# Patient Record
Sex: Female | Born: 1935 | Race: White | Hispanic: No | Smoking: Never smoker
Health system: Southern US, Community
[De-identification: ages and names within clinical notes are randomized; demographics above are authoritative.]

## PROBLEM LIST (undated history)

## (undated) DIAGNOSIS — K224 Dyskinesia of esophagus: Secondary | ICD-10-CM

## (undated) DIAGNOSIS — I10 Essential (primary) hypertension: Secondary | ICD-10-CM

## (undated) DIAGNOSIS — C541 Malignant neoplasm of endometrium: Secondary | ICD-10-CM

## (undated) DIAGNOSIS — M7550 Bursitis of unspecified shoulder: Secondary | ICD-10-CM

## (undated) DIAGNOSIS — J449 Chronic obstructive pulmonary disease, unspecified: Secondary | ICD-10-CM

## (undated) DIAGNOSIS — G4730 Sleep apnea, unspecified: Secondary | ICD-10-CM

## (undated) HISTORY — PX: CHOLECYSTECTOMY: SHX55

## (undated) HISTORY — PX: ABSCESS DRAINAGE: SHX1119

## (undated) HISTORY — PX: APPENDECTOMY: SHX54

## (undated) HISTORY — PX: ABDOMINAL HYSTERECTOMY: SHX81

## (undated) HISTORY — PX: ABDOMINAL SURGERY: SHX537

## (undated) HISTORY — PX: SALPINGECTOMY: SHX328

---

## 2009-06-30 ENCOUNTER — Ambulatory Visit: Payer: Self-pay | Admitting: Radiology

## 2009-06-30 ENCOUNTER — Emergency Department (HOSPITAL_BASED_OUTPATIENT_CLINIC_OR_DEPARTMENT_OTHER): Admission: EM | Admit: 2009-06-30 | Discharge: 2009-06-30 | Payer: Self-pay | Admitting: Emergency Medicine

## 2010-11-30 IMAGING — CR DG KNEE COMPLETE 4+V*R*
4 series · 4 of 4 positions shown · non-contrast
Comparison: None

CLINICAL DATA: Right anterior and lateral knee pain - no known
injury

RIGHT KNEE - COMPLETE 4+ VIEW

[t knee ap right]
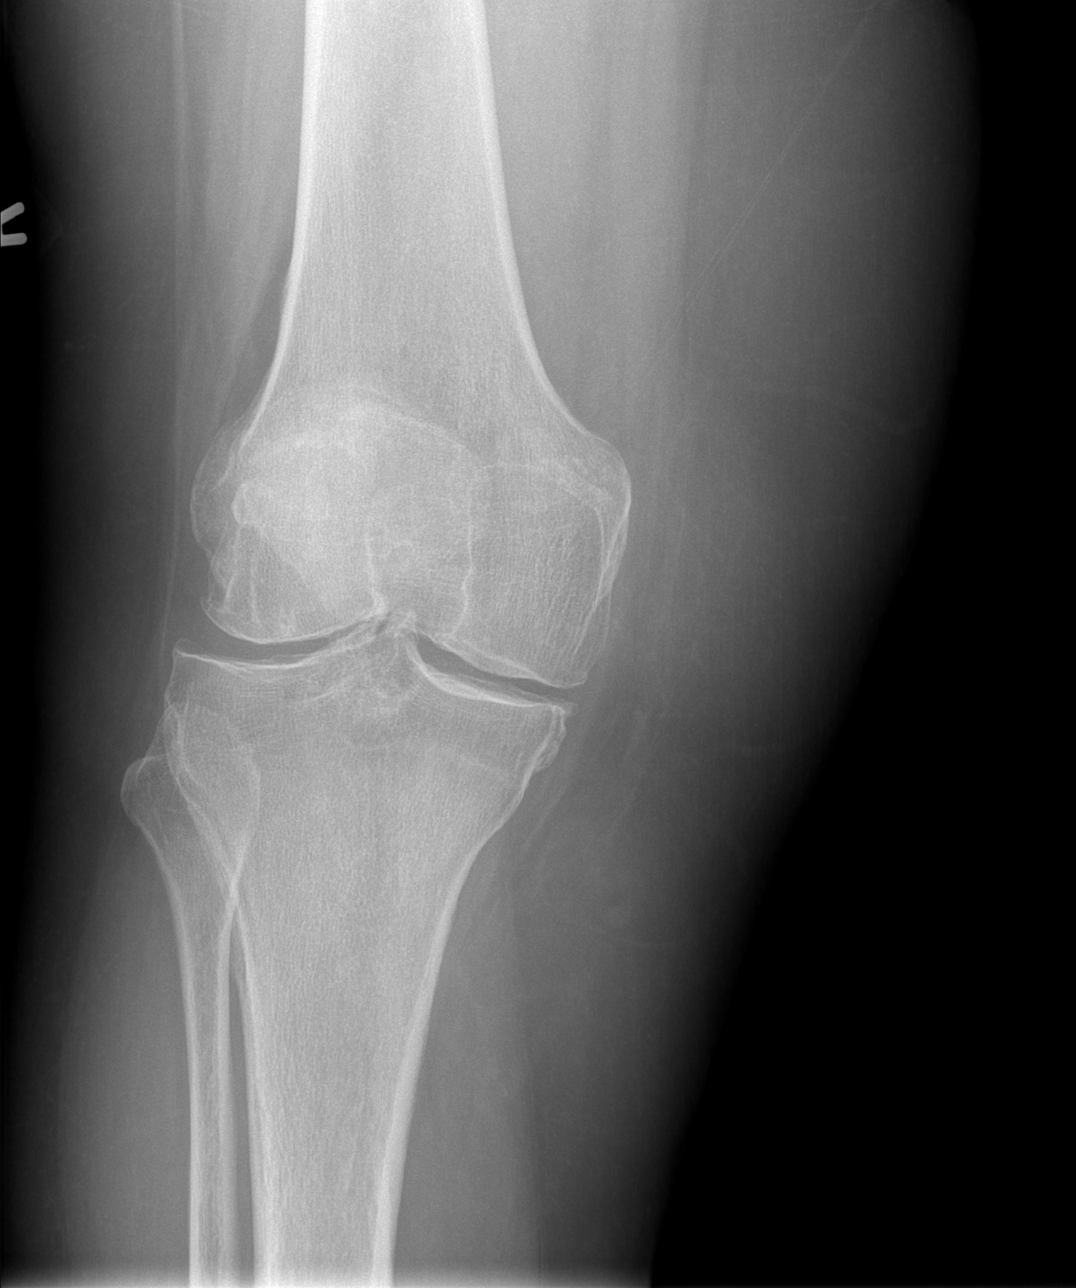

[t knee oblique right (1 of 2)]
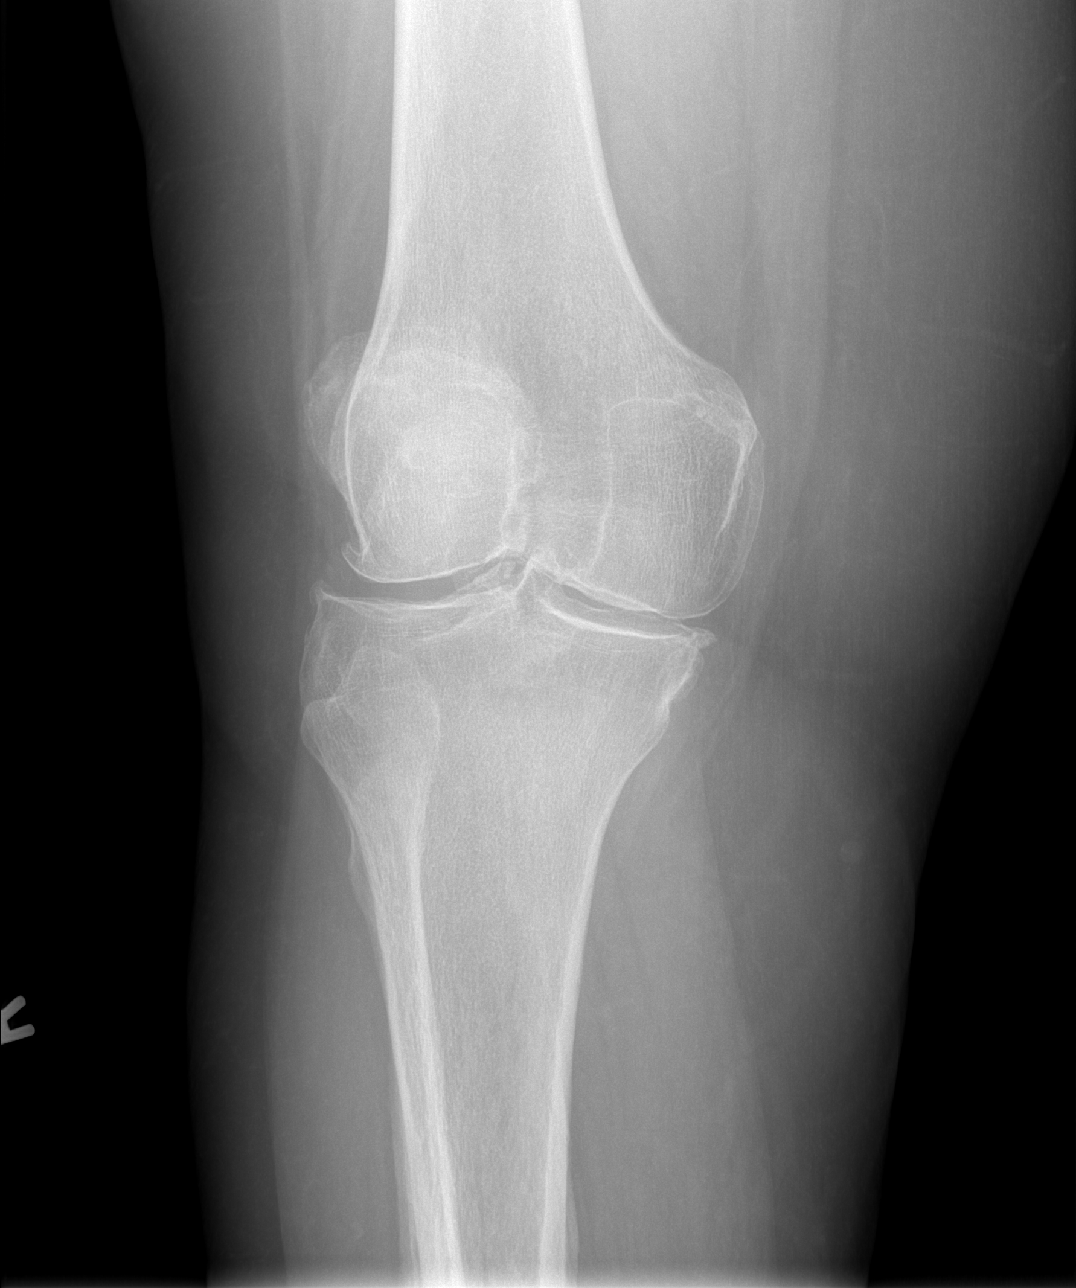

[t knee oblique right (2 of 2)]
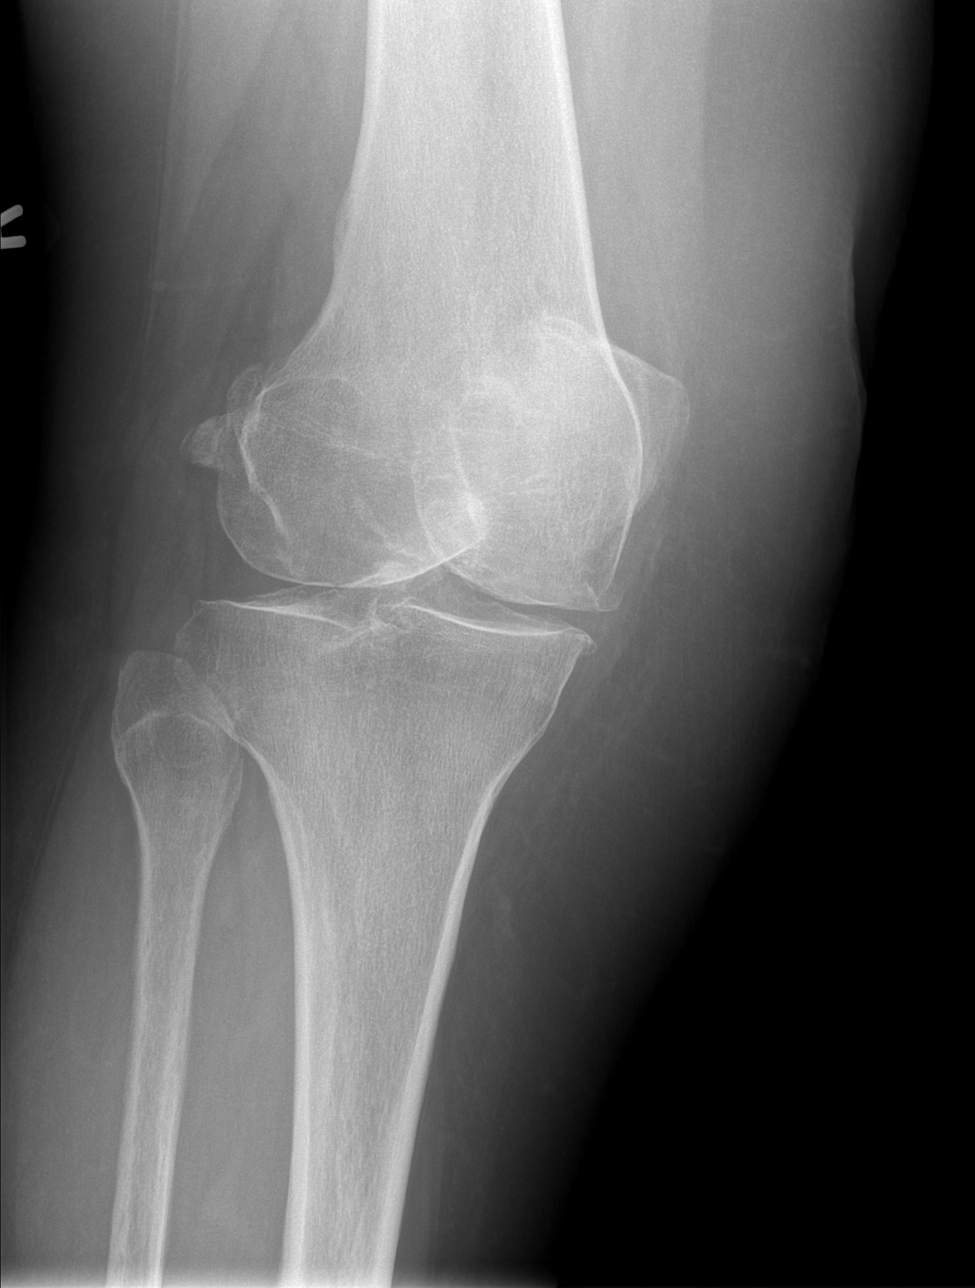

[t knee lat right]
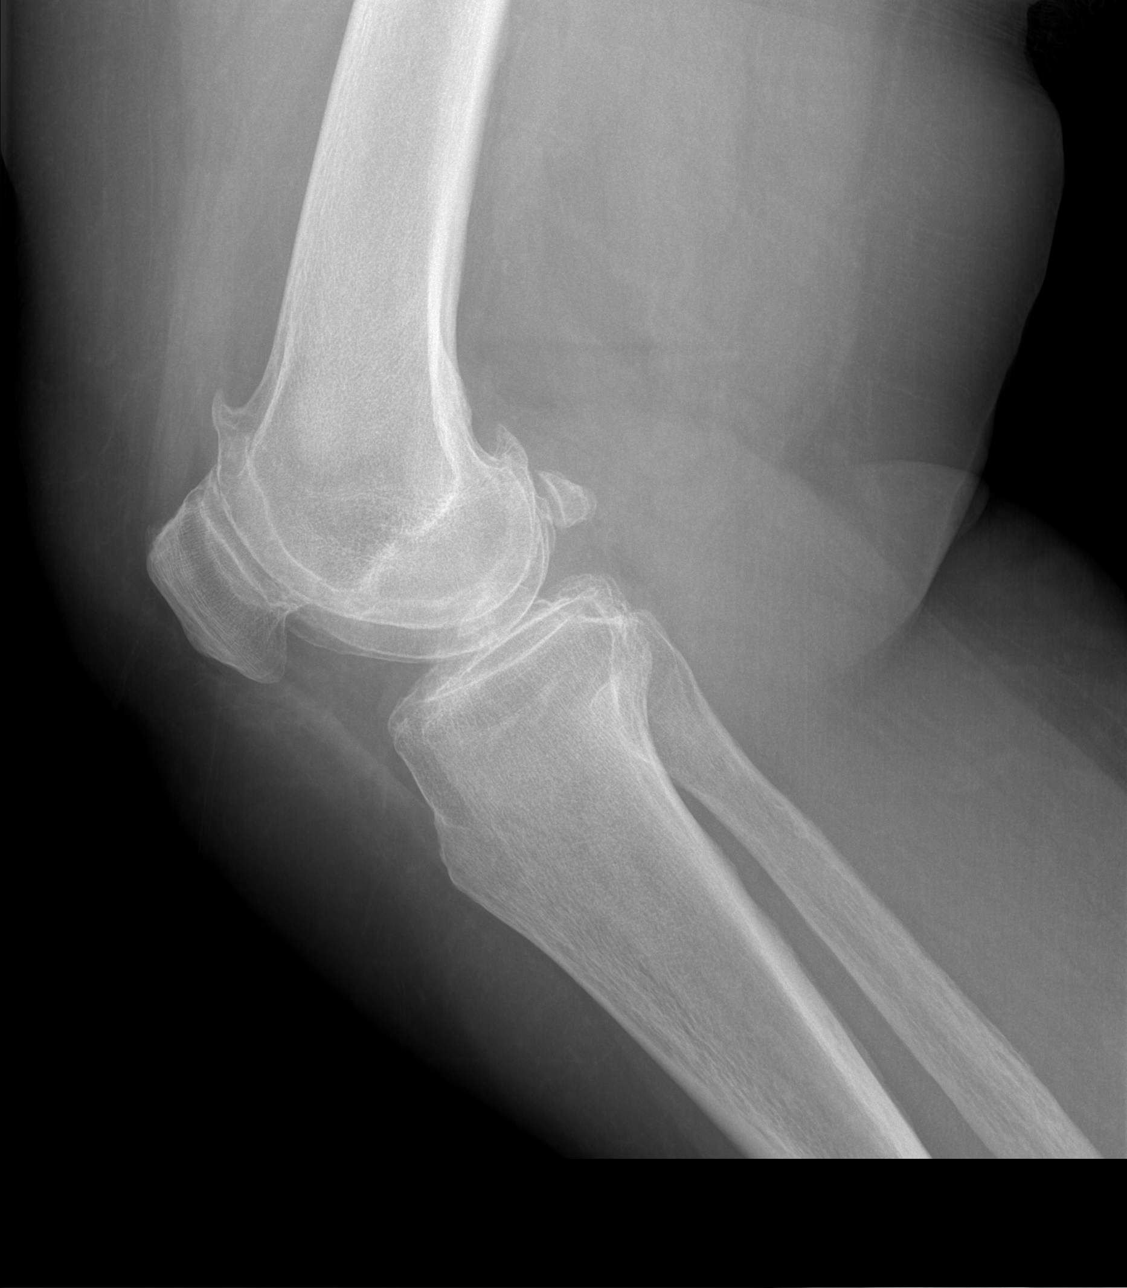

[4 of 4 positions shown; findings below may reference images not displayed]

FINDINGS: Moderate to advanced tricompartmental degenerative
changes with no fracture or dislocation.  No definite joint
effusion.

On the lateral view, there appear to be exostosis emanating off the
distal femur, anteriorly and posteriorly.
IMPRESSION: Degenerative changes and distal femoral exostoses - no acute
findings.

## 2012-10-23 ENCOUNTER — Emergency Department (HOSPITAL_BASED_OUTPATIENT_CLINIC_OR_DEPARTMENT_OTHER): Payer: Medicare Other

## 2012-10-23 ENCOUNTER — Emergency Department (HOSPITAL_BASED_OUTPATIENT_CLINIC_OR_DEPARTMENT_OTHER)
Admission: EM | Admit: 2012-10-23 | Discharge: 2012-10-23 | Disposition: A | Discharge status: Home / Self Care | Payer: Medicare Other | Attending: Emergency Medicine | Admitting: Emergency Medicine

## 2012-10-23 ENCOUNTER — Encounter (HOSPITAL_BASED_OUTPATIENT_CLINIC_OR_DEPARTMENT_OTHER): Payer: Self-pay | Admitting: *Deleted

## 2012-10-23 DIAGNOSIS — Z8739 Personal history of other diseases of the musculoskeletal system and connective tissue: Secondary | ICD-10-CM | POA: Insufficient documentation

## 2012-10-23 DIAGNOSIS — J449 Chronic obstructive pulmonary disease, unspecified: Secondary | ICD-10-CM | POA: Insufficient documentation

## 2012-10-23 DIAGNOSIS — R059 Cough, unspecified: Secondary | ICD-10-CM | POA: Insufficient documentation

## 2012-10-23 DIAGNOSIS — M25559 Pain in unspecified hip: Secondary | ICD-10-CM | POA: Insufficient documentation

## 2012-10-23 DIAGNOSIS — R05 Cough: Secondary | ICD-10-CM | POA: Insufficient documentation

## 2012-10-23 DIAGNOSIS — Z8542 Personal history of malignant neoplasm of other parts of uterus: Secondary | ICD-10-CM | POA: Insufficient documentation

## 2012-10-23 DIAGNOSIS — J4489 Other specified chronic obstructive pulmonary disease: Secondary | ICD-10-CM | POA: Insufficient documentation

## 2012-10-23 DIAGNOSIS — Z8719 Personal history of other diseases of the digestive system: Secondary | ICD-10-CM | POA: Insufficient documentation

## 2012-10-23 DIAGNOSIS — R5383 Other fatigue: Secondary | ICD-10-CM

## 2012-10-23 DIAGNOSIS — R5381 Other malaise: Secondary | ICD-10-CM | POA: Insufficient documentation

## 2012-10-23 DIAGNOSIS — J3489 Other specified disorders of nose and nasal sinuses: Secondary | ICD-10-CM | POA: Insufficient documentation

## 2012-10-23 DIAGNOSIS — M25519 Pain in unspecified shoulder: Secondary | ICD-10-CM | POA: Insufficient documentation

## 2012-10-23 DIAGNOSIS — Z79899 Other long term (current) drug therapy: Secondary | ICD-10-CM | POA: Insufficient documentation

## 2012-10-23 DIAGNOSIS — I10 Essential (primary) hypertension: Secondary | ICD-10-CM | POA: Insufficient documentation

## 2012-10-23 HISTORY — DX: Sleep apnea, unspecified: G47.30

## 2012-10-23 HISTORY — DX: Chronic obstructive pulmonary disease, unspecified: J44.9

## 2012-10-23 HISTORY — DX: Malignant neoplasm of endometrium: C54.1

## 2012-10-23 HISTORY — DX: Dyskinesia of esophagus: K22.4

## 2012-10-23 HISTORY — DX: Bursitis of unspecified shoulder: M75.50

## 2012-10-23 HISTORY — DX: Essential (primary) hypertension: I10

## 2012-10-23 LAB — COMPREHENSIVE METABOLIC PANEL
ALT: 34 U/L (ref 0–35)
AST: 24 U/L (ref 0–37)
Alkaline Phosphatase: 81 U/L (ref 39–117)
BUN: 35 mg/dL — ABNORMAL HIGH (ref 6–23)
Calcium: 10.1 mg/dL (ref 8.4–10.5)
Chloride: 98 mEq/L (ref 96–112)
Creatinine, Ser: 0.7 mg/dL (ref 0.50–1.10)
Glucose, Bld: 121 mg/dL — ABNORMAL HIGH (ref 70–99)
Sodium: 135 mEq/L (ref 135–145)
Total Bilirubin: 0.2 mg/dL — ABNORMAL LOW (ref 0.3–1.2)

## 2012-10-23 LAB — CBC WITH DIFFERENTIAL/PLATELET
Band Neutrophils: 1 % (ref 0–10)
Eosinophils Absolute: 0.4 10*3/uL (ref 0.0–0.7)
MCH: 30.2 pg (ref 26.0–34.0)
MCHC: 33.9 g/dL (ref 30.0–36.0)
MCV: 89 fL (ref 78.0–100.0)
Monocytes Relative: 10 % (ref 3–12)
Neutro Abs: 7 10*3/uL (ref 1.7–7.7)
Neutrophils Relative %: 66 % (ref 43–77)
Platelets: 302 10*3/uL (ref 150–400)
RBC: 5.2 MIL/uL — ABNORMAL HIGH (ref 3.87–5.11)
RDW: 15.1 % (ref 11.5–15.5)

## 2012-10-23 LAB — LIPASE, BLOOD: Lipase: 29 U/L (ref 11–59)

## 2012-10-23 MED ORDER — VALSARTAN 160 MG PO TABS: 80.0000 mg | ORAL_TABLET | Freq: Every day | ORAL | Status: AC

## 2012-10-23 MED ORDER — HYDROCODONE-ACETAMINOPHEN 10-325 MG PO TABS
1.0000 | ORAL_TABLET | Freq: Four times a day (QID) | ORAL | Status: DC | PRN
Start: 2012-10-23 — End: 2014-06-03

## 2012-10-23 NOTE — ED Provider Notes (Signed)
History     CSN: [id number]  Arrival date & time 10/23/12  1239   First MD Initiated Contact with Patient 10/23/12 1254      Chief Complaint  Patient presents with  . Hip Pain  . Cough  . Shoulder Pain    (Consider location/radiation/quality/duration/timing/severity/associated sxs/prior treatment) HPI  The patient presents with multiple complaints. Chief complaint 1 cough, congestion.  She states that these symptoms began approximately 2 months ago.  Since onset has been persistent, with minimal improvement after 2 courses of antibiotics.  No appreciable change, no chest pain, dyspnea throughout. Chief complaint #2 left shoulder pain.  This began approximately one month ago, without clear precipitant other than possible awkward motion.  She states that since that time there has been pain persistently in left shoulder, sore, worse with motion or weight-bearing.  Minimally improved with OTC medication. Chief complaint #3 right hip pain.  This pain has been approximately one month ago without clear precipitant.  Since onset she has been evaluated with x-ray, and has had minimal relief with OTC medication.  The pain is worse with motion, is sore.  There is no associated dysuria or incontinence. Chief complaint for fatigue.  This has been ongoing for greater than 2 months, seems more present when the patient is active during the day.   Past Medical History  Diagnosis Date  . Hypertension   . Esophageal spasm   . COPD (chronic obstructive pulmonary disease)   . Sleep apnea     does not use cpap machine  . Bursitis of shoulder   . Endometrial ca     Past Surgical History  Procedure Date  . Cesarean section   . Salpingectomy   . Abdominal hysterectomy   . Appendectomy   . Cholecystectomy   . Abdominal surgery   . Abscess drainage     No family history on file.  History  Substance Use Topics  . Smoking status: Never Smoker   . Smokeless tobacco: Not on file  . Alcohol Use:  No    OB History    Grav Para Term Preterm Abortions TAB SAB Ect Mult Living                  Review of Systems  Constitutional:       Per HPI, otherwise negative  HENT:       Per HPI, otherwise negative  Eyes: Negative.   Respiratory:       Per HPI, otherwise negative  Cardiovascular:       Per HPI, otherwise negative  Gastrointestinal: Negative for vomiting.  Genitourinary: Negative.   Musculoskeletal:       Per HPI, otherwise negative  Skin: Negative.   Neurological: Negative for syncope.    Allergies  Quinine derivatives and Zithromax  Home Medications   Current Outpatient Rx  Name  Route  Sig  Dispense  Refill  . ALBUTEROL SULFATE HFA 108 (90 BASE) MCG/ACT IN AERS   Inhalation   Inhale 2 puffs into the lungs every 6 (six) hours as needed.         Marland Kitchen GLUCOSAMINE-CHONDROITIN 500-400 MG PO TABS   Oral   Take 1 tablet by mouth 3 (three) times daily.         Marland Kitchen HYDROCODONE-ACETAMINOPHEN 10-325 MG PO TABS   Oral   Take 1 tablet by mouth every 6 (six) hours as needed.         Marland Kitchen OLMESARTAN MEDOXOMIL 5 MG PO TABS  Oral   Take 5 mg by mouth daily.         Marland Kitchen SPIRONOLACTONE 25 MG PO TABS   Oral   Take 25 mg by mouth daily.           BP 143/88  Pulse 74  Temp 98.4 F (36.9 C) (Oral)  Resp 20  Ht 5\' 7"  (1.702 m)  Wt 240 lb (108.863 kg)  BMI 37.59 kg/m2  SpO2 100%  Physical Exam  Nursing note and vitals reviewed. Constitutional: She is oriented to person, place, and time. She appears well-developed and well-nourished. No distress.  HENT:  Head: Normocephalic and atraumatic.  Eyes: Conjunctivae normal and EOM are normal.  Cardiovascular: Normal rate and regular rhythm.   Pulmonary/Chest: Effort normal and breath sounds normal. No stridor. No respiratory distress.  Abdominal: She exhibits no distension.  Musculoskeletal: She exhibits no edema.       Left shoulder: She exhibits tenderness, bony tenderness and pain. She exhibits no swelling, no  effusion, no crepitus, no deformity, normal pulse and normal strength.       Left elbow: Normal.       Legs: Neurological: She is alert and oriented to person, place, and time. No cranial nerve deficit.  Skin: Skin is warm and dry.  Psychiatric: She has a normal mood and affect.    ED Course  Procedures (including critical care time)   Labs Reviewed  CBC WITH DIFFERENTIAL  COMPREHENSIVE METABOLIC PANEL  LIPASE, BLOOD   No results found.   No diagnosis found.   O2- 99%ra, normal  2:51 PM I reviewed all results with the patient and her daughter.  The patient denies a history of COPD, which is in her notes. MDM  This patient presents with multiple complaints, none of which appear acutely new.  Overall, patient largely is concerned with fatigue, decreased capacity, and pain in several areas.  Her evaluation was largely unremarkable, her hemodynamic stability was also reassuring.  We discussed the need for primary care, orthopedics followup.  The patient requested a refill of several medications, this was accommodated.  Gerhard Munch, MD 10/23/12 1452

## 2012-10-23 NOTE — ED Notes (Addendum)
Patient states she had had cough and sinus congestion for the last 2 months.  Was seen at [facility] and treated with antibiotics.  States her right hip and left shoulder are now painful for one month.  Patient states she has multiple complaints of not feeling well for several months and just wants to know what is going on with her.

## 2014-03-25 IMAGING — CR DG CHEST 2V
2 series · 2 of 2 positions shown · non-contrast
Comparison: None.

CLINICAL DATA: Sick with cough and congestion.

CHEST - 2 VIEW

[w chest pa]
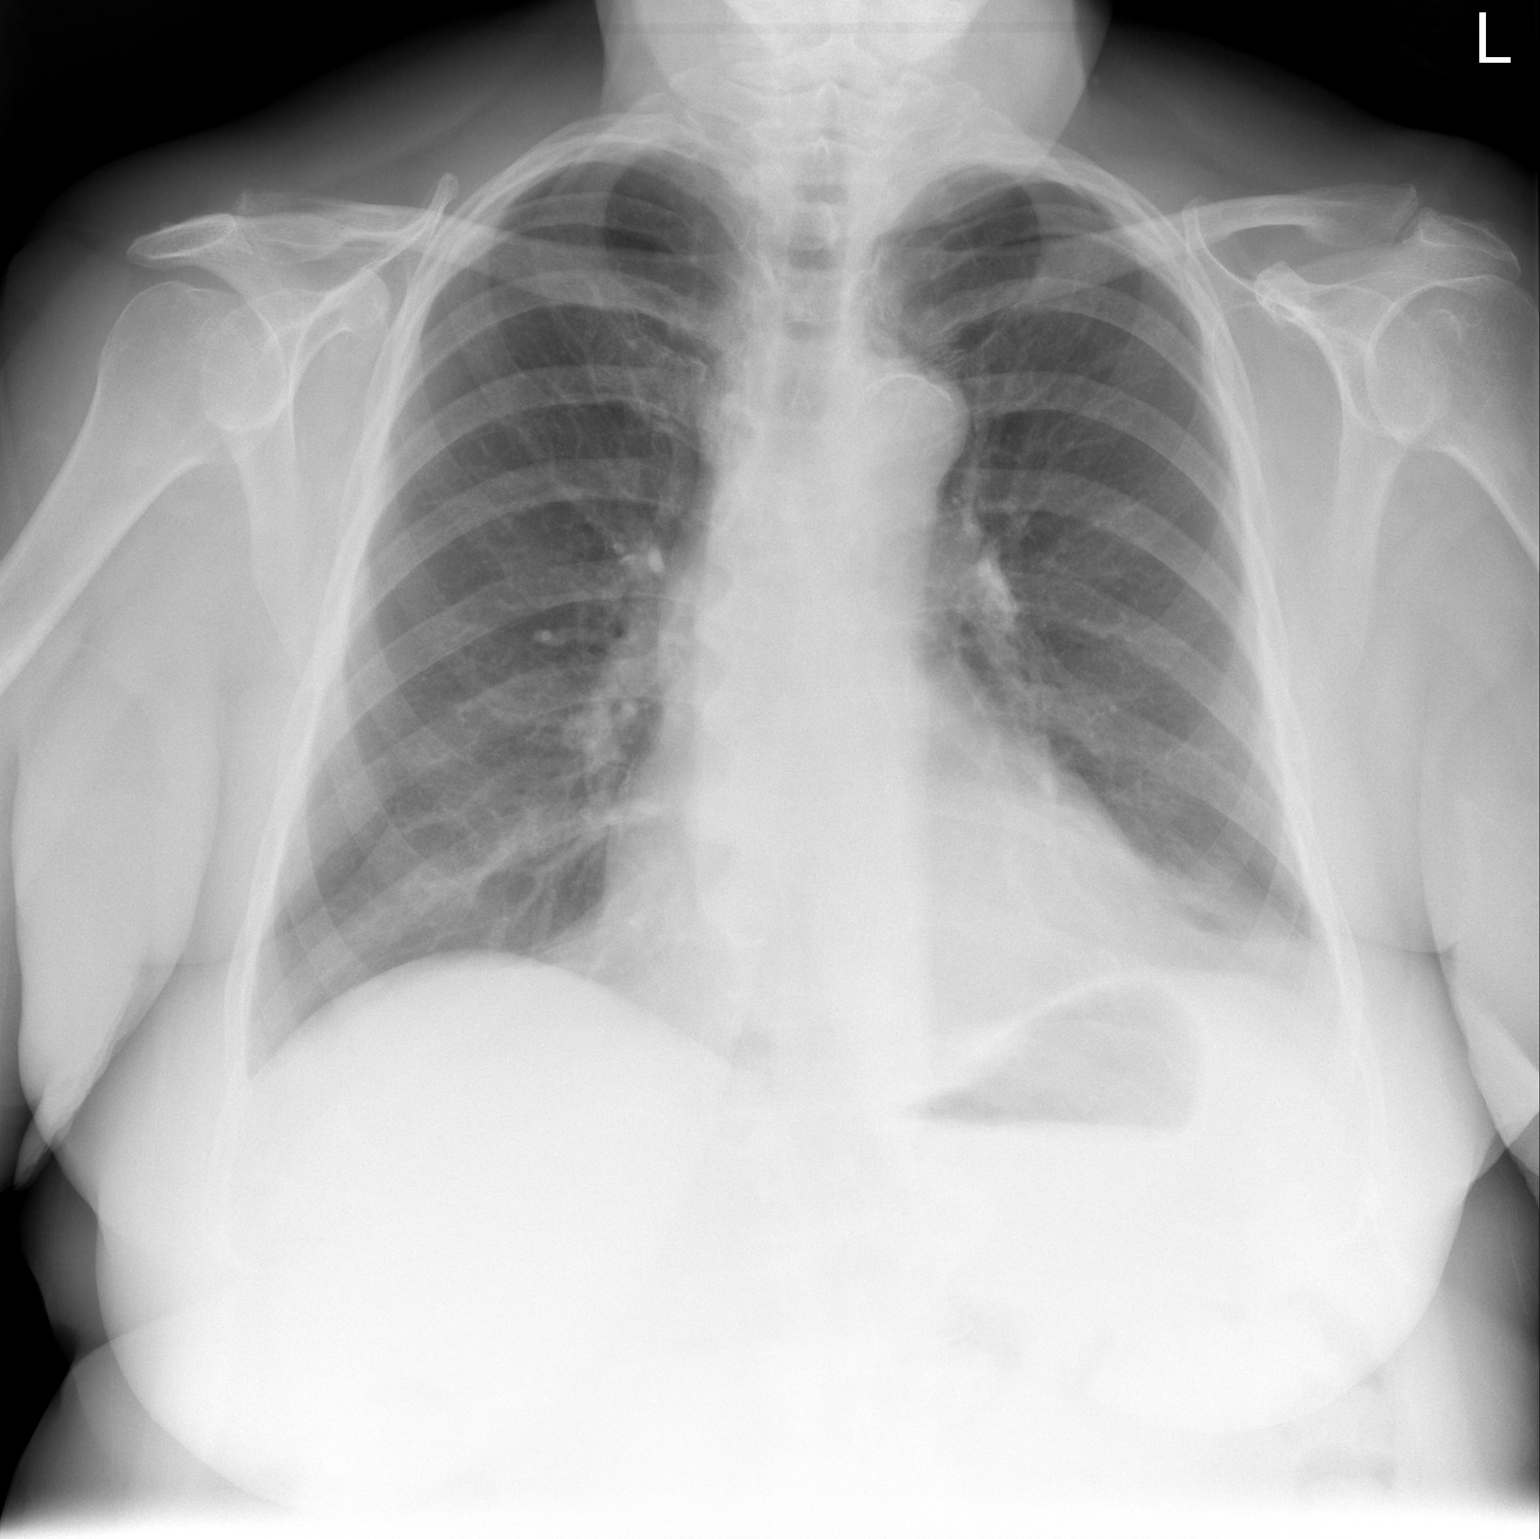

[w chest lat]
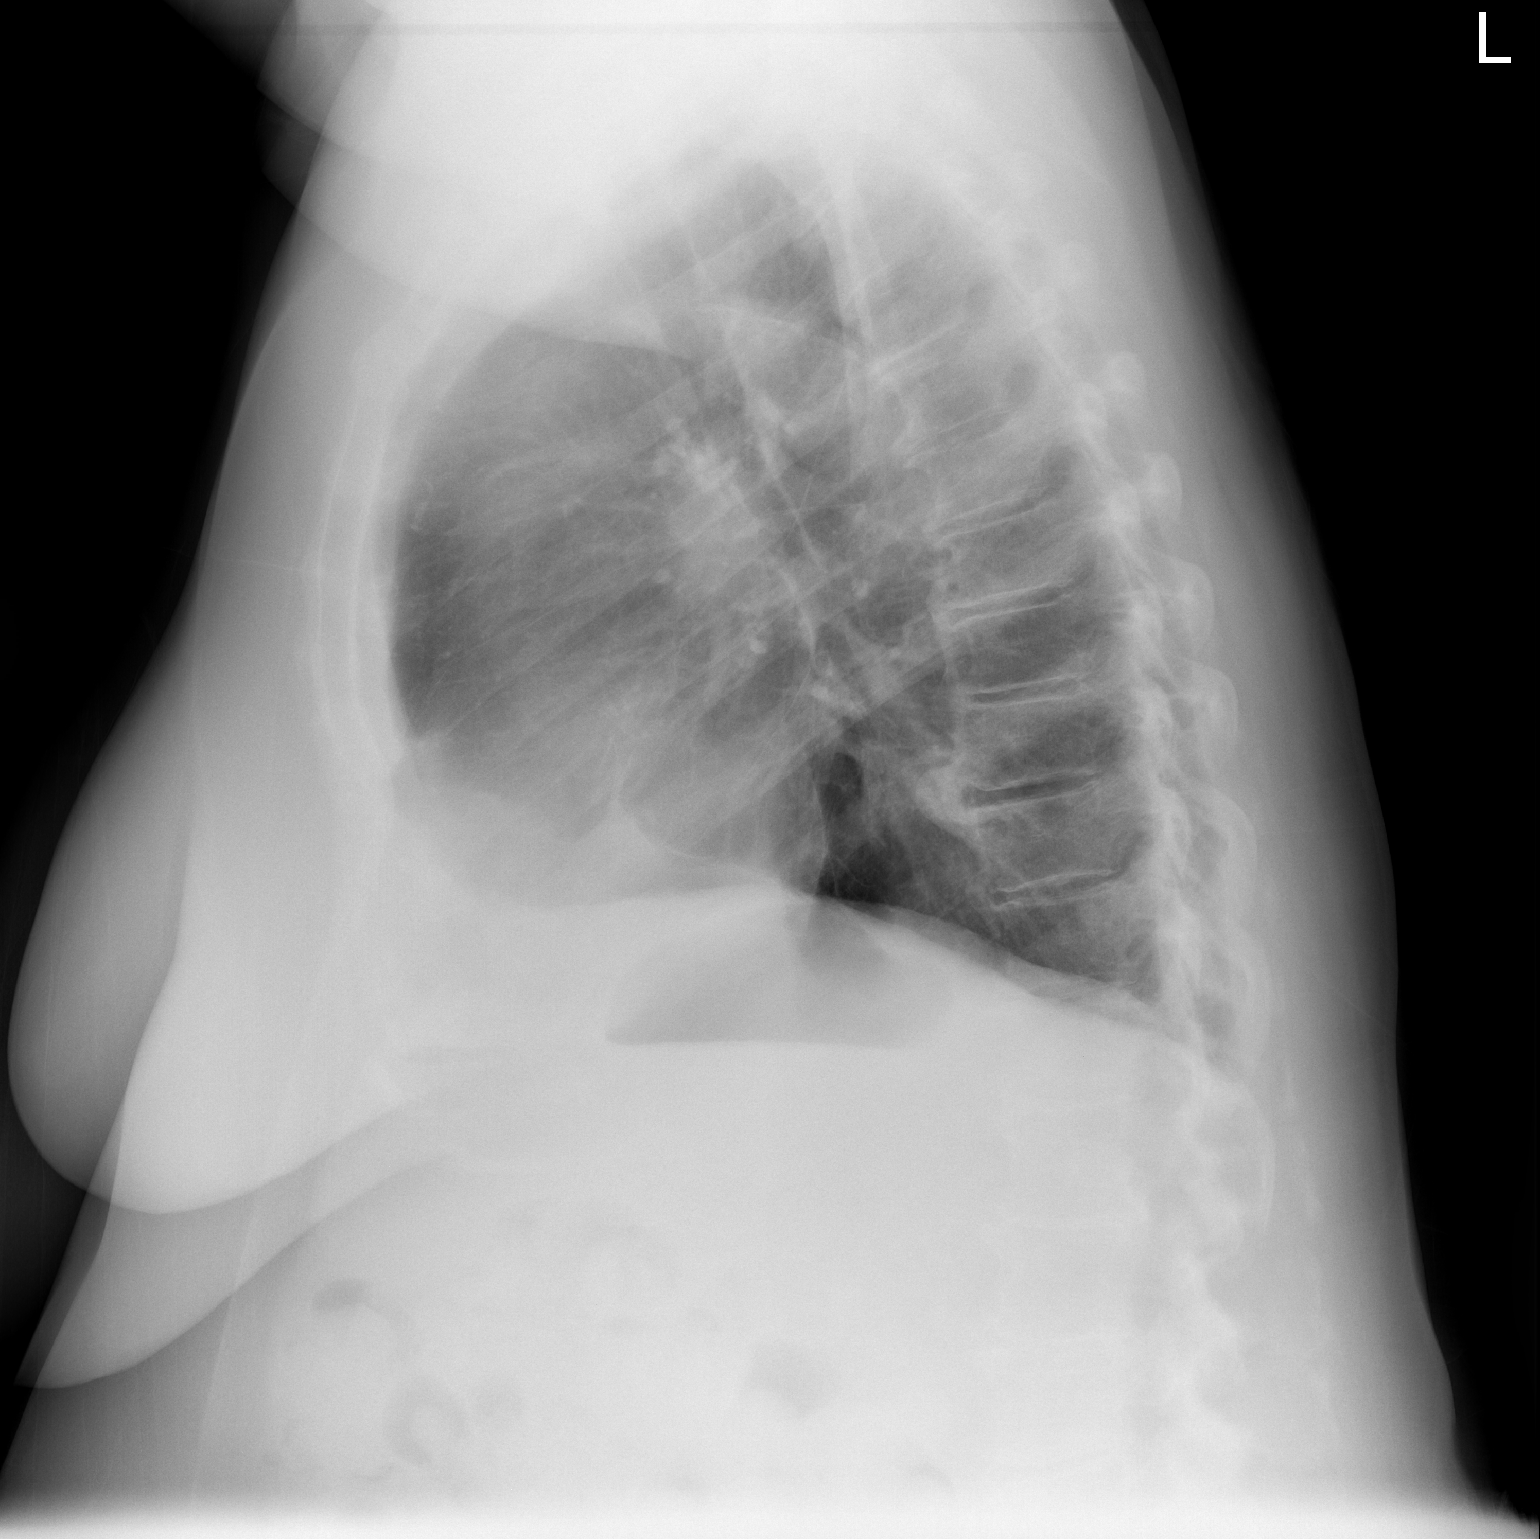

[2 of 2 positions shown; findings below may reference images not displayed]

FINDINGS: Two views of the chest were obtained.  Few densities at
the left lung base may represent atelectasis.  There is no evidence
for airspace disease or pleural effusions.  Heart size is within
normal limits.  Atherosclerotic calcifications involving the aortic
arch.  Trachea is midline.
IMPRESSION: Possible left basilar atelectasis.  Otherwise, no focal chest
disease.

## 2014-06-03 ENCOUNTER — Ambulatory Visit (INDEPENDENT_AMBULATORY_CARE_PROVIDER_SITE_OTHER): Payer: Medicare Other

## 2014-06-03 ENCOUNTER — Ambulatory Visit (INDEPENDENT_AMBULATORY_CARE_PROVIDER_SITE_OTHER): Payer: Medicare Other | Admitting: Podiatry

## 2014-06-03 VITALS — BP 157/89 | HR 64 | Resp 16 | Ht 67.0 in | Wt 240.0 lb

## 2014-06-03 DIAGNOSIS — G5760 Lesion of plantar nerve, unspecified lower limb: Secondary | ICD-10-CM

## 2014-06-03 DIAGNOSIS — M79609 Pain in unspecified limb: Secondary | ICD-10-CM

## 2014-06-03 DIAGNOSIS — M79673 Pain in unspecified foot: Secondary | ICD-10-CM

## 2014-06-03 NOTE — Progress Notes (Signed)
   Subjective:    Patient ID: Betty Dougherty, female    DOB: [birth year], 80 y.o.   MRN: [id number]  HPI Comments: Pt states her feet are burning numbness for the mid-metatarsals to the distal toe tip, and the pain is worse at night.  Pt states she can elicit a sharp when squeezing the base of the 2-4 th toes on both feet.  Pt states she has had the sclerosing injections in [place] in 2001, but can't remember what areas of both feet.  Pt states she has also been injected with the steroids for the neuromas also.     Review of Systems  All other systems reviewed and are negative.      Objective:   Physical Exam        Assessment & Plan:

## 2014-06-04 NOTE — Progress Notes (Signed)
Subjective:     Patient ID: Betty Dougherty, female   DOB: 1934/11/12, 79 y.o.   MRN: [id number]  HPI patient presents stating she's getting burning between the third and fourth toes of both feet with moderate forefoot discomfort in both feet noted. States that she had sclerosing injections years ago which gave her relief and that this is been a recent occurrence   Review of Systems  All other systems reviewed and are negative.      Objective:   Physical Exam  Nursing note and vitals reviewed. Constitutional: She is oriented to person, place, and time.  Cardiovascular: Intact distal pulses.   Musculoskeletal: Normal range of motion.  Neurological: She is oriented to person, place, and time.  Skin: Skin is warm and dry.   neurovascular status found to be intact with muscle strength adequate and range of motion subtalar and midtarsal joint within normal limits. Patient's found to have moderate forefoot and ankle edema with keratotic lesion noted and has digits that are well-perfused of both feet bilateral. 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 Today I injected the third interspace of both feet with a purified D. hydrated alcohol solution and Marcaine. Patient tolerated injections well and will be seen back to recheck again in 2 weeks for evaluation

## 2014-06-17 ENCOUNTER — Ambulatory Visit: Payer: Medicare Other | Admitting: Podiatry

## 2015-10-21 ENCOUNTER — Encounter (HOSPITAL_COMMUNITY): Payer: Self-pay | Admitting: Emergency Medicine

## 2015-10-21 ENCOUNTER — Emergency Department (HOSPITAL_COMMUNITY)
Admission: EM | Admit: 2015-10-21 | Discharge: 2015-10-22 | Disposition: A | Discharge status: Home / Self Care | Payer: Medicare (Managed Care) | Attending: Emergency Medicine | Admitting: Emergency Medicine

## 2015-10-21 DIAGNOSIS — Z8542 Personal history of malignant neoplasm of other parts of uterus: Secondary | ICD-10-CM | POA: Diagnosis not present

## 2015-10-21 DIAGNOSIS — Z8669 Personal history of other diseases of the nervous system and sense organs: Secondary | ICD-10-CM | POA: Insufficient documentation

## 2015-10-21 DIAGNOSIS — R231 Pallor: Secondary | ICD-10-CM | POA: Insufficient documentation

## 2015-10-21 DIAGNOSIS — Z79899 Other long term (current) drug therapy: Secondary | ICD-10-CM | POA: Diagnosis not present

## 2015-10-21 DIAGNOSIS — M25552 Pain in left hip: Secondary | ICD-10-CM | POA: Diagnosis not present

## 2015-10-21 DIAGNOSIS — R58 Hemorrhage, not elsewhere classified: Secondary | ICD-10-CM | POA: Insufficient documentation

## 2015-10-21 DIAGNOSIS — J449 Chronic obstructive pulmonary disease, unspecified: Secondary | ICD-10-CM | POA: Diagnosis not present

## 2015-10-21 DIAGNOSIS — R2243 Localized swelling, mass and lump, lower limb, bilateral: Secondary | ICD-10-CM | POA: Diagnosis present

## 2015-10-21 DIAGNOSIS — M25551 Pain in right hip: Secondary | ICD-10-CM | POA: Diagnosis not present

## 2015-10-21 DIAGNOSIS — R600 Localized edema: Secondary | ICD-10-CM

## 2015-10-21 DIAGNOSIS — I10 Essential (primary) hypertension: Secondary | ICD-10-CM | POA: Insufficient documentation

## 2015-10-21 DIAGNOSIS — Z8719 Personal history of other diseases of the digestive system: Secondary | ICD-10-CM | POA: Diagnosis not present

## 2015-10-21 LAB — CBC WITH DIFFERENTIAL/PLATELET
BASOS ABS: 0.1 10*3/uL (ref 0.0–0.1)
Basophils Relative: 1 %
EOS ABS: 0.1 10*3/uL (ref 0.0–0.7)
EOS PCT: 1 %
HCT: 43.8 % (ref 36.0–46.0)
HEMOGLOBIN: 14.7 g/dL (ref 12.0–15.0)
LYMPHS PCT: 21 %
Lymphs Abs: 1.9 10*3/uL (ref 0.7–4.0)
MCH: 28.9 pg (ref 26.0–34.0)
MCHC: 33.6 g/dL (ref 30.0–36.0)
MCV: 86.1 fL (ref 78.0–100.0)
Monocytes Absolute: 1.1 10*3/uL — ABNORMAL HIGH (ref 0.1–1.0)
Monocytes Relative: 12 %
NEUTROS PCT: 65 %
Neutro Abs: 6 10*3/uL (ref 1.7–7.7)
PLATELETS: 355 10*3/uL (ref 150–400)
RBC: 5.09 MIL/uL (ref 3.87–5.11)
RDW: 14.6 % (ref 11.5–15.5)
WBC: 9.2 10*3/uL (ref 4.0–10.5)

## 2015-10-21 LAB — COMPREHENSIVE METABOLIC PANEL
ALBUMIN: 3.7 g/dL (ref 3.5–5.0)
ALK PHOS: 71 U/L (ref 38–126)
ALT: 28 U/L (ref 14–54)
AST: 35 U/L (ref 15–41)
Anion gap: 12 (ref 5–15)
BUN: 28 mg/dL — AB (ref 6–20)
CALCIUM: 9.7 mg/dL (ref 8.9–10.3)
CO2: 25 mmol/L (ref 22–32)
CREATININE: 0.81 mg/dL (ref 0.44–1.00)
Chloride: 96 mmol/L — ABNORMAL LOW (ref 101–111)
GFR calc Af Amer: >60 mL/min (ref >60)
GFR calc non Af Amer: >60 mL/min (ref >60)
GLUCOSE: 119 mg/dL — AB (ref 65–99)
Potassium: 4.2 mmol/L (ref 3.5–5.1)
SODIUM: 133 mmol/L — AB (ref 135–145)
Total Bilirubin: 0.9 mg/dL (ref 0.3–1.2)
Total Protein: 6 g/dL — ABNORMAL LOW (ref 6.5–8.1)

## 2015-10-21 LAB — PROTIME-INR
INR: 1.03 (ref 0.00–1.49)
PROTHROMBIN TIME: 13.7 s (ref 11.6–15.2)

## 2015-10-21 LAB — BRAIN NATRIURETIC PEPTIDE: B NATRIURETIC PEPTIDE 5: 13.3 pg/mL (ref 0.0–100.0)

## 2015-10-21 MED ORDER — OXYCODONE-ACETAMINOPHEN 5-325 MG PO TABS
2.0000 | ORAL_TABLET | Freq: Once | ORAL | Status: AC
  Administered 2015-10-22: 2 via ORAL
  Filled 2015-10-21: qty 2

## 2015-10-21 MED ORDER — FENTANYL CITRATE (PF) 100 MCG/2ML IJ SOLN
50.0000 ug | Freq: Once | INTRAMUSCULAR | Status: AC
  Administered 2015-10-21: 50 ug via INTRAVENOUS
  Filled 2015-10-21: qty 2

## 2015-10-21 MED ORDER — SODIUM CHLORIDE 0.9 % IV BOLUS (SEPSIS)
500.0000 mL | Freq: Once | INTRAVENOUS | Status: AC
  Administered 2015-10-21: 500 mL via INTRAVENOUS

## 2015-10-21 NOTE — ED Notes (Signed)
EDP at bedside  

## 2015-10-21 NOTE — Discharge Instructions (Signed)
Edema °Edema is an abnormal buildup of fluids in your body tissues. Edema is somewhat dependent on gravity to pull the fluid to the lowest place in your body. That makes the condition more common in the legs and thighs (lower extremities). Painless swelling of the feet and ankles is common and becomes more likely as you get older. It is also common in looser tissues, like around your eyes.  °When the affected area is squeezed, the fluid may move out of that spot and leave a dent for a few moments. This dent is called pitting.  °CAUSES  °There are many possible causes of edema. Eating too much salt and being on your feet or sitting for a long time can cause edema in your legs and ankles. Hot weather may make edema worse. Common medical causes of edema include: °· Heart failure. °· Liver disease. °· Kidney disease. °· Weak blood vessels in your legs. °· Cancer. °· An injury. °· Pregnancy. °· Some medications. °· Obesity.  °SYMPTOMS  °Edema is usually painless. Your skin may look swollen or shiny.  °DIAGNOSIS  °Your health care provider may be able to diagnose edema by asking about your medical history and doing a physical exam. You may need to have tests such as X-rays, an electrocardiogram, or blood tests to check for medical conditions that may cause edema.  °TREATMENT  °Edema treatment depends on the cause. If you have heart, liver, or kidney disease, you need the treatment appropriate for these conditions. General treatment may include: °· Elevation of the affected body part above the level of your heart. °· Compression of the affected body part. Pressure from elastic bandages or support stockings squeezes the tissues and forces fluid back into the blood vessels. This keeps fluid from entering the tissues. °· Restriction of fluid and salt intake. °· Use of a water pill (diuretic). These medications are appropriate only for some types of edema. They pull fluid out of your body and make you urinate more often. This  gets rid of fluid and reduces swelling, but diuretics can have side effects. Only use diuretics as directed by your health care provider. °HOME CARE INSTRUCTIONS  °· Keep the affected body part above the level of your heart when you are lying down.   °· Do not sit still or stand for prolonged periods.   °· Do not put anything directly under your knees when lying down. °· Do not wear constricting clothing or garters on your upper legs.   °· Exercise your legs to work the fluid back into your blood vessels. This may help the swelling go down.   °· Wear elastic bandages or support stockings to reduce ankle swelling as directed by your health care provider.   °· Eat a low-salt diet to reduce fluid if your health care provider recommends it.   °· Only take medicines as directed by your health care provider.  °SEEK MEDICAL CARE IF:  °· Your edema is not responding to treatment. °· You have heart, liver, or kidney disease and notice symptoms of edema. °· You have edema in your legs that does not improve after elevating them.   °· You have sudden and unexplained weight gain. °SEEK IMMEDIATE MEDICAL CARE IF:  °· You develop shortness of breath or chest pain.   °· You cannot breathe when you lie down. °· You develop pain, redness, or warmth in the swollen areas.   °· You have heart, liver, or kidney disease and suddenly get edema. °· You have a fever and your symptoms suddenly get worse. °MAKE SURE YOU:  °·   Understand these instructions.  Will watch your condition.  Will get help right away if you are not doing well or get worse.   This information is not intended to replace advice given to you by your health care provider. Make sure you discuss any questions you have with your health care provider.   Document Released: 09/06/2005 Document Revised: 09/27/2014 Document Reviewed: 06/29/2013 Elsevier Interactive Patient Education 2016 [employer]  Hip Pain Your hip is the joint between your upper legs and your  lower pelvis. The bones, cartilage, tendons, and muscles of your hip joint perform a lot of work each day supporting your body weight and allowing you to move around. Hip pain can range from a minor ache to severe pain in one or both of your hips. Pain may be felt on the inside of the hip joint near the groin, or the outside near the buttocks and upper thigh. You may have swelling or stiffness as well.  HOME CARE INSTRUCTIONS   Take medicines only as directed by your health care provider.  Apply ice to the injured area:  Put ice in a plastic bag.  Place a towel between your skin and the bag.  Leave the ice on for 15-20 minutes at a time, 3-4 times a day.  Keep your leg raised (elevated) when possible to lessen swelling.  Avoid activities that cause pain.  Follow specific exercises as directed by your health care provider.  Sleep with a pillow between your legs on your most comfortable side.  Record how often you have hip pain, the location of the pain, and what it feels like. SEEK MEDICAL CARE IF:   You are unable to put weight on your leg.  Your hip is red or swollen or very tender to touch.  Your pain or swelling continues or worsens after 1 week.  You have increasing difficulty walking.  You have a fever. SEEK IMMEDIATE MEDICAL CARE IF:   You have fallen.  You have a sudden increase in pain and swelling in your hip. MAKE SURE YOU:   Understand these instructions.  Will watch your condition.  Will get help right away if you are not doing well or get worse.   This information is not intended to replace advice given to you by your health care provider. Make sure you discuss any questions you have with your health care provider.   Document Released: 02/24/2010 Document Revised: 09/27/2014 Document Reviewed: 05/03/2013 Elsevier Interactive Patient Education 2016 [place] [employer] Joint pain, which is also called arthralgia, can be caused by many things.  Joint pain often goes away when you follow your health care provider's instructions for relieving pain at home. However, joint pain can also be caused by conditions that require further treatment. Common causes of joint pain include:  Bruising in the area of the joint.  Overuse of the joint.  Wear and tear on the joints that occur with aging (osteoarthritis).  Various other forms of arthritis.  A buildup of a crystal form of uric acid in the joint (gout).  Infections of the joint (septic arthritis) or of the bone (osteomyelitis). Your health care provider may recommend medicine to help with the pain. If your joint pain continues, additional tests may be needed to diagnose your condition. HOME CARE INSTRUCTIONS Watch your condition for any changes. Follow these instructions as directed to lessen the pain that you are feeling.  Take medicines only as directed by your health care provider.  Rest  the affected area for as long as your health care provider says that you should. If directed to do so, raise the painful joint above the level of your heart while you are sitting or lying down.  Do not do things that cause or worsen pain.  If directed, apply ice to the painful area:  Put ice in a plastic bag.  Place a towel between your skin and the bag.  Leave the ice on for 20 minutes, 2-3 times per day.  Wear an elastic bandage, splint, or sling as directed by your health care provider. Loosen the elastic bandage or splint if your fingers or toes become numb and tingle, or if they turn cold and blue.  Begin exercising or stretching the affected area as directed by your health care provider. Ask your health care provider what types of exercise are safe for you.  Keep all follow-up visits as directed by your health care provider. This is important. SEEK MEDICAL CARE IF:  Your pain increases, and medicine does not help.  Your joint pain does not improve within 3 days.  You have  increased bruising or swelling.  You have a fever.  You lose 10 lb (4.5 kg) or more without trying. SEEK IMMEDIATE MEDICAL CARE IF:  You are not able to move the joint.  Your fingers or toes become numb or they turn cold and blue.   This information is not intended to replace advice given to you by your health care provider. Make sure you discuss any questions you have with your health care provider.   Document Released: 09/06/2005 Document Revised: 09/27/2014 Document Reviewed: 06/18/2014 Elsevier Interactive Patient Education 2016 [employer]  Peripheral Edema You have swelling in your legs (peripheral edema). This swelling is due to excess accumulation of salt and water in your body. Edema may be a sign of heart, kidney or liver disease, or a side effect of a medication. It may also be due to problems in the leg veins. Elevating your legs and using special support stockings may be very helpful, if the cause of the swelling is due to poor venous circulation. Avoid long periods of standing, whatever the cause. Treatment of edema depends on identifying the cause. Chips, pretzels, pickles and other salty foods should be avoided. Restricting salt in your diet is almost always needed. Water pills (diuretics) are often used to remove the excess salt and water from your body via urine. These medicines prevent the kidney from reabsorbing sodium. This increases urine flow. Diuretic treatment may also result in lowering of potassium levels in your body. Potassium supplements may be needed if you have to use diuretics daily. Daily weights can help you keep track of your progress in clearing your edema. You should call your caregiver for follow up care as recommended. SEEK IMMEDIATE MEDICAL CARE IF:   You have increased swelling, pain, redness, or heat in your legs.  You develop shortness of breath, especially when lying down.  You develop chest or abdominal pain, weakness, or fainting.  You  have a fever.   This information is not intended to replace advice given to you by your health care provider. Make sure you discuss any questions you have with your health care provider.   Document Released: 10/14/2004 Document Revised: 11/29/2011 Document Reviewed: 03/19/2015 Elsevier Interactive Patient Education [employer].

## 2015-10-21 NOTE — ED Provider Notes (Signed)
CSN: [id number]     Arrival date & time 10/21/15  2008 History   First MD Initiated Contact with Patient 10/21/15 2014     Chief Complaint  Patient presents with  . Leg Swelling     (Consider location/radiation/quality/duration/timing/severity/associated sxs/prior Treatment) HPI   Betty Dougherty is a 81 y.o. female with hx of HTN, COPD, osteoarthritis, OSA, she presents to the ED from home via EMS with complaints of worsening LE pain and swelling which worsened over the last month, but severely worsened over the past week, causing her to be non-ambulatory at home.  At her baseline she ambulates at home with a walker and states that she holds on to objects drug house to help her with her balance. She lives with her daughter.  She states that over the last month with the worsening edema that she has had some generalized weakness when trying to walk. She denies chest pain, shortness of breath, near-syncope, orthopnea, palpitations.  Her baseline and her left lower extremity is slightly more edematous than her right. She states that prior to month ago she had progressive swelling throughout the day and that would resolve with elevating her legs.  One month ago began to be consistent without any leaf after sleeping with elevation of legs. This past week she was put on Lasix 20 mg twice a day which she states she's been compliant with over the last 3 days. She was instructed to increase her dose but states that she could not tolerate a higher dose.  She reports a history of hyperkalemia and sensitivity to blood pressure medications and diuretics.    Past Medical History  Diagnosis Date  . Hypertension   . Esophageal spasm   . COPD (chronic obstructive pulmonary disease) ([place])   . Sleep apnea     does not use cpap machine  . Bursitis of shoulder   . Endometrial ca [facility])    Past Surgical History  Procedure Laterality Date  . Cesarean section    . Salpingectomy    . Abdominal hysterectomy    .  Appendectomy    . Cholecystectomy    . Abdominal surgery    . Abscess drainage     No family history on file. Social History  Substance Use Topics  . Smoking status: Never Smoker   . Smokeless tobacco: None  . Alcohol Use: No   OB History    No data available     Review of Systems  All other systems reviewed and are negative.     Allergies  Quinine derivatives and Zithromax  Home Medications   Prior to Admission medications   Medication Sig Start Date End Date Taking? Authorizing Provider  albuterol (PROVENTIL HFA;VENTOLIN HFA) 108 (90 BASE) MCG/ACT inhaler Inhale 2 puffs into the lungs every 6 (six) hours as needed.   Yes Historical Provider, MD  cetirizine (ZYRTEC) 10 MG tablet Take 10 mg by mouth daily.   Yes Historical Provider, MD  Echinacea 125 MG CAPS Take 1 capsule by mouth daily.    Yes Historical Provider, MD  furosemide (LASIX) 20 MG tablet Take 20 mg by mouth daily as needed for fluid.    Yes Historical Provider, MD  glucosamine-chondroitin 500-400 MG tablet Take 1 tablet by mouth 3 (three) times daily.   Yes Historical Provider, MD  hydrocodone-ibuprofen (VICOPROFEN) 5-200 MG per tablet Take 1 tablet by mouth every 6 (six) hours as needed for pain.   Yes Historical Provider, MD  ibuprofen (ADVIL,MOTRIN) 800 MG  tablet Take 800 mg by mouth every 8 (eight) hours as needed for moderate pain.   Yes Historical Provider, MD  magnesium 30 MG tablet Take 30 mg by mouth 2 (two) times daily.   Yes Historical Provider, MD  methocarbamol (ROBAXIN) 500 MG tablet Take 500 mg by mouth 4 (four) times daily.   Yes Historical Provider, MD  Multiple Vitamins-Minerals (MULTI COMPLETE PO) Take by mouth.   Yes Historical Provider, MD  Multiple Vitamins-Minerals (ZINC PO) Take 1 tablet by mouth daily.   Yes Historical Provider, MD  nitroGLYCERIN (NITROSTAT) 0.4 MG SL tablet Place 0.4 mg under the tongue every 5 (five) minutes as needed for chest pain.   Yes Historical Provider, MD   Omega-3 Fatty Acids (FISH OIL) 1000 MG CAPS Take 1 capsule by mouth daily.    Yes Historical Provider, MD  Probiotic Product (PROBIOTIC DAILY PO) Take by mouth.   Yes Historical Provider, MD  spironolactone (ALDACTONE) 25 MG tablet Take 25 mg by mouth daily.   Yes Historical Provider, MD  valsartan (DIOVAN) 160 MG tablet Take 0.5 tablets (80 mg total) by mouth daily. 10/23/12  Yes Carmin Muskrat, MD  loratadine (CLARITIN) 10 MG tablet Take 10 mg by mouth daily. Reported on 10/21/2015    Historical Provider, MD   BP 154/66 mmHg  Pulse 71  Temp(Src) 97.7 F (36.5 C) (Oral)  Resp 14  Ht 5\' 7"  (1.702 m)  Wt 117.935 kg  BMI 40.71 kg/m2  SpO2 94% Physical Exam  Constitutional: She is oriented to person, place, and time. She appears well-developed and well-nourished. No distress.  HENT:  Head: Normocephalic and atraumatic.  Nose: Nose normal.  Mouth/Throat: Oropharynx is clear and moist. No oropharyngeal exudate.  Eyes: Conjunctivae and EOM are normal. Pupils are equal, round, and reactive to light. Right eye exhibits no discharge. Left eye exhibits no discharge. No scleral icterus.  Neck: Normal range of motion. No JVD present. No tracheal deviation present. No thyromegaly present.  Cardiovascular: Normal rate, regular rhythm, normal heart sounds and intact distal pulses.  Exam reveals no gallop and no friction rub.   No murmur heard. Pulmonary/Chest: Effort normal and breath sounds normal. No respiratory distress. She has no wheezes. She has no rales. She exhibits no tenderness.  Abdominal: Soft. Bowel sounds are normal. She exhibits no distension and no mass. There is no tenderness. There is no rebound and no guarding.  Musculoskeletal: Normal range of motion. She exhibits edema and tenderness.  Bilateral lower extremity was edematous with erythematous dorsum of the lateral feet, 2-3+ pitting edema pedal to pretibial Large area of ecchymosis to left inner proximal thigh  Lymphadenopathy:     She has no cervical adenopathy.  Neurological: She is alert and oriented to person, place, and time. She has normal reflexes. No cranial nerve deficit. She exhibits normal muscle tone. Coordination normal.  Skin: Skin is warm and dry. No rash noted. She is not diaphoretic. There is erythema. There is pallor.  Psychiatric: She has a normal mood and affect. Her behavior is normal. Judgment and thought content normal.  Nursing note and vitals reviewed.   ED Course  Procedures (including critical care time) Labs Review Labs Reviewed  CBC WITH DIFFERENTIAL/PLATELET - Abnormal; Notable for the following:    Monocytes Absolute 1.1 (*)    All other components within normal limits  COMPREHENSIVE METABOLIC PANEL - Abnormal; Notable for the following:    Sodium 133 (*)    Chloride 96 (*)    Glucose,  Bld 119 (*)    BUN 28 (*)    Total Protein 6.0 (*)    All other components within normal limits  PROTIME-INR  BRAIN NATRIURETIC PEPTIDE    Imaging Review No results found. I have personally reviewed and evaluated these images and lab results as part of my medical decision-making.   EKG Interpretation   Date/Time:  Tuesday October 21 2015 21:07:05 EST Ventricular Rate:  77 PR Interval:  152 QRS Duration: 95 QT Interval:  381 QTC Calculation: 431 R Axis:   -18 Text Interpretation:  Sinus rhythm Borderline left axis deviation Low  voltage, precordial leads ED PHYSICIAN INTERPRETATION AVAILABLE IN [facility] Confirmed by TEST, Record ([id number]) on 10/22/2015 7:06:11 AM      MDM   Patient with acute on chronic lower extremity pain and edema, patient has had difficulty ambulating over the last week. Labs initiated to check for liver function, kidney function, BNP.  Pt appears to be very uncomfortable, given pain meds, placed on monitor.  Was hypertensive SBP~200, improved to 160's with tx of pain.  Labs without acute abnormalities, mild hyponatremia, consistent with her hx,normal BNP,  LFT's and GFR.  No concern for heart, liver or renal failure causing edema.  Pt had no injury, has reported chronic hip pain and bursitis, no erythema to suggest septic bursitis.  Dr. Jeneen Rinks has personally seen and evaluated the patient, he is in agreement with workup and assessment. Pt's pain is chronic.  Her daughters were later at bedside and stated she is in pain mgmt and has also stopped going to her ortho for injections for hip pain and bursitis. LE edema has recently worsened over the past month, pt has only had 3 days of diuretics, and has not been compliant with dose increases as directed by her PCP because she was scared of the effect on her potassium levels.  She was encouraged to follow his medical advice.  Daughters state pt has not been ambulatory for most of the last year, only walking in her room or a few feet to the bathroom.  They have had to recently switch PCP's and state they have requested home assistance for the pt.  Dr. Jeneen Rinks and I agree pt is stable to discharge home.  Face-to-face eval ordered, pt likely would benefit from in home health.  Filed Vitals:   10/22/15 0000 10/22/15 0030  BP: 170/82 154/66  Pulse: 82 71  Temp:    Resp: 22 14    Final diagnoses:  Bilateral lower extremity edema  Hip pain, bilateral      Delsa Grana, PA-C 10/22/15 2323  Tanna Furry, MD 10/25/15 [phone]

## 2015-10-21 NOTE — ED Notes (Signed)
Pt in EMS from home, reporting leg swelling for past week. States hx of swelling but has become increasingly worse. Also reporting severe pain in L inner thigh area, bruise size of softball noted. Pt denies any injury. Denies hx blood clots

## 2016-04-20 DEATH — deceased
